# Patient Record
Sex: Male | Born: 1965 | Race: Black or African American | Hispanic: No | Smoking: Never smoker
Health system: Southern US, Community
[De-identification: ages and names within clinical notes are randomized; demographics above are authoritative.]

## PROBLEM LIST (undated history)

## (undated) DIAGNOSIS — I10 Essential (primary) hypertension: Secondary | ICD-10-CM

---

## 2014-02-01 ENCOUNTER — Emergency Department (HOSPITAL_COMMUNITY): Payer: No Typology Code available for payment source

## 2014-02-01 ENCOUNTER — Emergency Department (HOSPITAL_COMMUNITY)
Admission: EM | Admit: 2014-02-01 | Discharge: 2014-02-01 | Disposition: A | Discharge status: Home / Self Care | Payer: No Typology Code available for payment source | Attending: Emergency Medicine | Admitting: Emergency Medicine

## 2014-02-01 ENCOUNTER — Encounter (HOSPITAL_COMMUNITY): Payer: Self-pay | Admitting: Emergency Medicine

## 2014-02-01 DIAGNOSIS — I10 Essential (primary) hypertension: Secondary | ICD-10-CM | POA: Insufficient documentation

## 2014-02-01 DIAGNOSIS — T148XXA Other injury of unspecified body region, initial encounter: Secondary | ICD-10-CM

## 2014-02-01 DIAGNOSIS — S2020XA Contusion of thorax, unspecified, initial encounter: Secondary | ICD-10-CM | POA: Insufficient documentation

## 2014-02-01 DIAGNOSIS — Z79899 Other long term (current) drug therapy: Secondary | ICD-10-CM | POA: Insufficient documentation

## 2014-02-01 DIAGNOSIS — R109 Unspecified abdominal pain: Secondary | ICD-10-CM | POA: Insufficient documentation

## 2014-02-01 DIAGNOSIS — IMO0001 Reserved for inherently not codable concepts without codable children: Secondary | ICD-10-CM | POA: Insufficient documentation

## 2014-02-01 DIAGNOSIS — Y92410 Unspecified street and highway as the place of occurrence of the external cause: Secondary | ICD-10-CM | POA: Insufficient documentation

## 2014-02-01 DIAGNOSIS — Y9389 Activity, other specified: Secondary | ICD-10-CM | POA: Insufficient documentation

## 2014-02-01 HISTORY — DX: Essential (primary) hypertension: I10

## 2014-02-01 LAB — COMPREHENSIVE METABOLIC PANEL
ALT: 38 U/L (ref 0–53)
AST: 22 U/L (ref 0–37)
Albumin: 4.1 g/dL (ref 3.5–5.2)
Alkaline Phosphatase: 57 U/L (ref 39–117)
BUN: 8 mg/dL (ref 6–23)
CALCIUM: 9.4 mg/dL (ref 8.4–10.5)
CO2: 24 mEq/L (ref 19–32)
CREATININE: 0.94 mg/dL (ref 0.50–1.35)
Chloride: 98 mEq/L (ref 96–112)
GFR calc Af Amer: >90 mL/min (ref >90)
Glucose, Bld: 146 mg/dL — ABNORMAL HIGH (ref 70–99)
Potassium: 3.1 mEq/L — ABNORMAL LOW (ref 3.7–5.3)
Sodium: 136 mEq/L — ABNORMAL LOW (ref 137–147)
TOTAL PROTEIN: 7.7 g/dL (ref 6.0–8.3)
Total Bilirubin: 0.7 mg/dL (ref 0.3–1.2)

## 2014-02-01 LAB — CBC WITH DIFFERENTIAL/PLATELET
BASOS ABS: 0 10*3/uL (ref 0.0–0.1)
BASOS PCT: 0 % (ref 0–1)
EOS ABS: 0.1 10*3/uL (ref 0.0–0.7)
EOS PCT: 1 % (ref 0–5)
HEMATOCRIT: 44.6 % (ref 39.0–52.0)
Hemoglobin: 15 g/dL (ref 13.0–17.0)
Lymphocytes Relative: 17 % (ref 12–46)
Lymphs Abs: 1 10*3/uL (ref 0.7–4.0)
MCH: 28.4 pg (ref 26.0–34.0)
MCHC: 33.6 g/dL (ref 30.0–36.0)
MCV: 84.3 fL (ref 78.0–100.0)
MONO ABS: 0.6 10*3/uL (ref 0.1–1.0)
Monocytes Relative: 10 % (ref 3–12)
Neutro Abs: 4.1 10*3/uL (ref 1.7–7.7)
Neutrophils Relative %: 71 % (ref 43–77)
Platelets: 514 10*3/uL — ABNORMAL HIGH (ref 150–400)
RBC: 5.29 MIL/uL (ref 4.22–5.81)
RDW: 13.5 % (ref 11.5–15.5)
WBC: 5.7 10*3/uL (ref 4.0–10.5)

## 2014-02-01 MED ORDER — IOHEXOL 300 MG/ML  SOLN
100.0000 mL | Freq: Once | INTRAMUSCULAR | Status: AC | PRN
  Administered 2014-02-01: 100 mL via INTRAVENOUS

## 2014-02-01 MED ORDER — HYDROCODONE-ACETAMINOPHEN 5-325 MG PO TABS: 1.0000 | ORAL_TABLET | Freq: Four times a day (QID) | ORAL | Status: AC | PRN

## 2014-02-01 MED ORDER — IBUPROFEN 600 MG PO TABS: 600.0000 mg | ORAL_TABLET | Freq: Four times a day (QID) | ORAL | Status: AC | PRN

## 2014-02-01 MED ORDER — SODIUM CHLORIDE 0.9 % IV BOLUS (SEPSIS)
1000.0000 mL | Freq: Once | INTRAVENOUS | Status: AC
  Administered 2014-02-01: 1000 mL via INTRAVENOUS
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
